# Patient Record
Sex: Female | Born: 1955 | Race: White | Hispanic: No | Marital: Single | Smoking: Never smoker
Health system: Southern US, Community
[De-identification: ages and names within clinical notes are randomized; demographics above are authoritative.]

## PROBLEM LIST (undated history)

## (undated) DIAGNOSIS — F32A Depression, unspecified: Secondary | ICD-10-CM

## (undated) DIAGNOSIS — F329 Major depressive disorder, single episode, unspecified: Secondary | ICD-10-CM

## (undated) HISTORY — DX: Depression, unspecified: F32.A

## (undated) HISTORY — DX: Major depressive disorder, single episode, unspecified: F32.9

---

## 1997-12-14 ENCOUNTER — Other Ambulatory Visit: Admission: RE | Admit: 1997-12-14 | Discharge: 1997-12-14 | Payer: Self-pay | Admitting: *Deleted

## 1999-02-02 ENCOUNTER — Other Ambulatory Visit: Admission: RE | Admit: 1999-02-02 | Discharge: 1999-02-02 | Payer: Self-pay | Admitting: *Deleted

## 2000-02-20 ENCOUNTER — Other Ambulatory Visit: Admission: RE | Admit: 2000-02-20 | Discharge: 2000-02-20 | Payer: Self-pay | Admitting: *Deleted

## 2001-02-21 ENCOUNTER — Other Ambulatory Visit: Admission: RE | Admit: 2001-02-21 | Discharge: 2001-02-21 | Payer: Self-pay | Admitting: *Deleted

## 2001-09-23 ENCOUNTER — Encounter: Admission: RE | Admit: 2001-09-23 | Discharge: 2001-09-23 | Payer: Self-pay | Admitting: Family Medicine

## 2001-09-23 ENCOUNTER — Encounter: Payer: Self-pay | Admitting: Family Medicine

## 2002-03-02 ENCOUNTER — Other Ambulatory Visit: Admission: RE | Admit: 2002-03-02 | Discharge: 2002-03-02 | Payer: Self-pay | Admitting: Obstetrics and Gynecology

## 2003-04-07 ENCOUNTER — Other Ambulatory Visit: Admission: RE | Admit: 2003-04-07 | Discharge: 2003-04-07 | Payer: Self-pay | Admitting: Obstetrics and Gynecology

## 2006-04-16 ENCOUNTER — Ambulatory Visit: Payer: Self-pay | Admitting: Family Medicine

## 2006-05-07 ENCOUNTER — Ambulatory Visit: Payer: Self-pay | Admitting: Internal Medicine

## 2006-05-16 ENCOUNTER — Ambulatory Visit: Payer: Self-pay | Admitting: Internal Medicine

## 2006-10-09 ENCOUNTER — Ambulatory Visit: Payer: Self-pay | Admitting: Internal Medicine

## 2007-12-02 ENCOUNTER — Encounter: Payer: Self-pay | Admitting: Family Medicine

## 2008-03-23 ENCOUNTER — Ambulatory Visit: Payer: Self-pay | Admitting: Family Medicine

## 2008-05-17 ENCOUNTER — Ambulatory Visit: Payer: Self-pay | Admitting: Family Medicine

## 2008-05-17 DIAGNOSIS — F329 Major depressive disorder, single episode, unspecified: Secondary | ICD-10-CM

## 2008-06-08 ENCOUNTER — Ambulatory Visit: Payer: Self-pay | Admitting: Family Medicine

## 2008-06-08 DIAGNOSIS — G4700 Insomnia, unspecified: Secondary | ICD-10-CM | POA: Insufficient documentation

## 2008-06-09 ENCOUNTER — Encounter (INDEPENDENT_AMBULATORY_CARE_PROVIDER_SITE_OTHER): Payer: Self-pay | Admitting: *Deleted

## 2008-06-28 ENCOUNTER — Telehealth: Payer: Self-pay | Admitting: Family Medicine

## 2008-07-02 ENCOUNTER — Ambulatory Visit: Payer: Self-pay | Admitting: Family Medicine

## 2009-03-21 ENCOUNTER — Ambulatory Visit: Payer: Self-pay | Admitting: Family Medicine

## 2009-05-31 ENCOUNTER — Encounter: Admission: RE | Admit: 2009-05-31 | Discharge: 2009-05-31 | Payer: Self-pay | Admitting: Obstetrics and Gynecology

## 2010-03-29 ENCOUNTER — Ambulatory Visit: Payer: Self-pay | Admitting: Family Medicine

## 2010-03-29 DIAGNOSIS — R030 Elevated blood-pressure reading, without diagnosis of hypertension: Secondary | ICD-10-CM

## 2010-03-30 ENCOUNTER — Encounter (INDEPENDENT_AMBULATORY_CARE_PROVIDER_SITE_OTHER): Payer: Self-pay | Admitting: *Deleted

## 2010-05-04 ENCOUNTER — Encounter (INDEPENDENT_AMBULATORY_CARE_PROVIDER_SITE_OTHER): Payer: Self-pay | Admitting: *Deleted

## 2010-05-08 ENCOUNTER — Ambulatory Visit: Payer: Self-pay | Admitting: Internal Medicine

## 2010-05-18 ENCOUNTER — Ambulatory Visit: Payer: Self-pay | Admitting: Internal Medicine

## 2010-05-18 LAB — HM COLONOSCOPY

## 2010-05-20 ENCOUNTER — Encounter: Payer: Self-pay | Admitting: Internal Medicine

## 2010-06-01 ENCOUNTER — Encounter: Admission: RE | Admit: 2010-06-01 | Discharge: 2010-06-01 | Payer: Self-pay | Admitting: Obstetrics and Gynecology

## 2010-07-10 ENCOUNTER — Ambulatory Visit: Payer: Self-pay | Admitting: Family Medicine

## 2010-07-10 DIAGNOSIS — J029 Acute pharyngitis, unspecified: Secondary | ICD-10-CM

## 2010-07-10 DIAGNOSIS — J069 Acute upper respiratory infection, unspecified: Secondary | ICD-10-CM | POA: Insufficient documentation

## 2010-07-10 LAB — CONVERTED CEMR LAB: Rapid Strep: NEGATIVE

## 2010-08-17 ENCOUNTER — Encounter
Admission: RE | Admit: 2010-08-17 | Discharge: 2010-08-17 | Payer: Self-pay | Source: Home / Self Care | Attending: Obstetrics and Gynecology | Admitting: Obstetrics and Gynecology

## 2010-08-29 NOTE — Letter (Signed)
Summary: Patient Notice- Polyp Results   Gastroenterology  [address] [name], [place] [address]   Phone: [phone]  Fax: [phone]        May 20, 2010 MRN: [id number]    [facility] [place] [address] [place], [place]  [address]    Dear Jaime Henderson,  I am pleased to inform you that the colon polyp(s) removed during your recent colonoscopy was (were) found to be benign (no cancer detected) upon pathologic examination.The polyps were adenomatous ( precancerous)  I recommend you have a repeat colonoscopy examination in 5_ years to look for recurrent polyps, as having colon polyps increases your risk for having recurrent polyps or even colon cancer in the future.  Should you develop new or worsening symptoms of abdominal pain, bowel habit changes or bleeding from the rectum or bowels, please schedule an evaluation with either your primary care physician or with me.  Additional information/recommendations:  _x_ No further action with gastroenterology is needed at this time. Please      follow-up with your primary care physician for your other healthcare      needs.  __ Please call [phone] to schedule a return visit to review your      situation.  __ Please keep your follow-up visit as already scheduled.  __ Continue treatment plan as outlined the day of your exam.  Please call us if you are having persistent problems or have questions about your condition that have not been fully answered at this time.  Sincerely,  Hart Carwin MD  This letter has been electronically signed by your physician.  Appended Document: Patient Notice- Polyp Results letter mailed

## 2010-08-29 NOTE — Procedures (Signed)
Summary: Colonoscopy  Patient: Nhyla Nappi Note: All result statuses are Final unless otherwise noted.  Tests: (1) Colonoscopy (COL)   COL Colonoscopy           DONE     [facility]     [address]     [place], [place]  [address]           COLONOSCOPY PROCEDURE REPORT           PATIENT:  Prudence, Heiny  MR#:  [id number]     BIRTHDATE:  [birth year], 54 yrs. old  GENDER:  female     ENDOSCOPIST:  Hedwig Morton. Juanda Chance, MD     REF. BY:  Loreen Freud, DO     PROCEDURE DATE:  05/18/2010     PROCEDURE:  Colonoscopy [address]     ASA CLASS:  Class I     INDICATIONS:  Routine Risk Screening     MEDICATIONS:   Versed 10 mg, Fentanyl 100 mcg           DESCRIPTION OF PROCEDURE:   After the risks benefits and     alternatives of the procedure were thoroughly explained, informed     consent was obtained.  Digital rectal exam was performed and     revealed no rectal masses.   The LB [id number] endoscope     was introduced through the anus and advanced to the cecum, which     was identified by both the appendix and ileocecal valve, without     limitations.  The quality of the prep was good, using MiraLax.     The instrument was then slowly withdrawn as the colon was fully     examined.     <<PROCEDUREIMAGES>>           FINDINGS:  Two polyps were found in the sigmoid colon. 3-5 mm     sessile polyps at 20 and 30 cm Polyp was snared without cautery.     Retrieval was successful (see image5 and image8). snare polyp     Moderate diverticulosis was found throughout the colon (see image7     and image1).  This was otherwise a normal examination of the colon     (see image9, image4, image3, and image2).   Retroflexed views in     the rectum revealed no abnormalities.    The scope was then     withdrawn from the patient and the procedure completed.           COMPLICATIONS:  None     ENDOSCOPIC IMPRESSION:     1) Two polyps in the sigmoid colon     2) Moderate diverticulosis throughout the  colon     3) Otherwise normal examination     RECOMMENDATIONS:     1) Await pathology results     2) High fiber diet.     REPEAT EXAM:  In 5 year(s) for.           ______________________________     Hedwig Morton. Juanda Chance, MD           CC:           n.     eSIGNED:   Hedwig Morton. Tayley Mudrick at 05/18/2010 09:50 AM           Aundra Millet, [id number]  Note: An exclamation mark (!) indicates a result that was not dispersed into the flowsheet. Document Creation Date: 05/18/2010 9:50 AM _______________________________________________________________________  (1) Order result status:  Final Collection or observation date-time: 05/18/2010 09:42 Requested date-time:  Receipt date-time:  Reported date-time:  Referring Physician:   Ordering Physician: Lina Sar [id number]) Specimen Source:  Source: Launa Grill Order Number: [id number] Lab site:   Appended Document: Colonoscopy     Procedures Next Due Date:    Colonoscopy: 04/2015

## 2010-08-29 NOTE — Assessment & Plan Note (Signed)
Summary: NEEDS O/V TO REFILL ZOLOFT////SPH   Vital Signs:  Patient profile:   55 year old female Height:      71 inches Weight:      161.8 pounds BMI:     22.65 Temp:     98.1 degrees F oral Pulse rate:   68 / minute Pulse rhythm:   regular BP sitting:   140 / 96  (right arm)  Vitals Entered By: Almeta Monas CMA Duncan Dull) (March 29, 2010 8:20 AM)  Serial Vital Signs/Assessments:  Time      Position  BP       Pulse  Resp  Temp     By 8:38 AM             140/82                         Loreen Freud DO  CC: Needs Zoloft refilled   History of Present Illness: Pt here for med refill.  zoloft working well.  No complaints.    Current Medications (verified): 1)  Zoloft 50 Mg Tabs (Sertraline Hcl) .... Take 1 Tab Once Daily**office Visit Due Now**  Allergies (verified): No Known Drug Allergies  Family History: Reviewed history from 03/23/2008 and no changes required. B-- aplastic anemia Family History Breast cancer 1st degree relative 56yo Family History of Prostate CA 1st degree relative 56yo  Social History: Reviewed history from 05/17/2008 and no changes required. Divorced Drug use-no Regular exercise-yes  Review of Systems      See HPI  Physical Exam  General:  Well-developed,well-nourished,in no acute distress; alert,appropriate and cooperative throughout examination Psych:  Cognition and judgment appear intact. Alert and cooperative with normal attention span and concentration. No apparent delusions, illusions, hallucinations   Impression & Recommendations:  Problem # 1:  DEPRESSION (ICD-311)  Her updated medication list for this problem includes:    Zoloft 50 Mg Tabs (Sertraline hcl) .Marland Kitchen... Take 1 tab once daily  Problem # 2:  ELEVATED BLOOD PRESSURE (ICD-796.2)  BP today: 140/96 Prior BP: 140/100 (03/21/2009)  Instructed in low sodium diet (DASH Handout) and behavior modification.    Complete Medication List: 1)  Zoloft 50 Mg Tabs (Sertraline hcl)  .... Take 1 tab once daily  Other Orders: Gastroenterology Referral (GI)  Patient Instructions: 1)  Please schedule a follow-up appointment in 3 months .  Prescriptions: ZOLOFT 50 MG TABS (SERTRALINE HCL) take 1 tab once daily  #30 x 11   Entered and Authorized by:   Loreen Freud DO   Signed by:   Loreen Freud DO on 03/29/2010   Method used:   Electronically to        [facility] (retail)       [address]       [place], [place]  [address]       Ph: [phone]       Fax: [phone]   RxID:   [id number]

## 2010-08-29 NOTE — Letter (Signed)
Summary: Pre Visit Letter Revised  [facility]  [address] [place], [place] [address]   Phone: [phone]  Fax: [phone]        03/30/2010 MRN: [id number] Jaime Henderson [address] [place], [place]  [address]             Procedure Date:  05-18-10   Welcome to the Gastroenterology Division at [facility].    You are scheduled to see a nurse for your pre-procedure visit on 05-04-10 at 4:00p.m. on the 3rd floor at [facility], [address]  We ask that you try to arrive at our office 15 minutes prior to your appointment time to allow for check-in.  Please take a minute to review the attached form.  If you answer "Yes" to one or more of the questions on the first page, we ask that you call the person listed at your earliest opportunity.  If you answer "No" to all of the questions, please complete the rest of the form and bring it to your appointment.    Your nurse visit will consist of discussing your medical and surgical history, your immediate family medical history, and your medications.   If you are unable to list all of your medications on the form, please bring the medication bottles to your appointment and we will list them.  We will need to be aware of both prescribed and over the counter drugs.  We will need to know exact dosage information as well.    Please be prepared to read and sign documents such as consent forms, a financial agreement, and acknowledgement forms.  If necessary, and with your consent, a friend or relative is welcome to sit-in on the nurse visit with you.  Please bring your insurance card so that we may make a copy of it.  If your insurance requires a referral to see a specialist, please bring your referral form from your primary care physician.  No co-pay is required for this nurse visit.     If you cannot keep your appointment, please call [phone] to cancel or reschedule prior to your appointment date.  This allows [place]  the opportunity to schedule an appointment for another patient in need of care.    Thank you for choosing [facility] for your medical needs.  We appreciate the opportunity to care for you.  Please visit [place] at our website  to learn more about our practice.  Sincerely, The Gastroenterology Division

## 2010-08-29 NOTE — Miscellaneous (Signed)
Summary: [facility]  Clinical Lists Changes  Medications: Added new medication of MIRALAX   POWD (POLYETHYLENE GLYCOL 3350) As per prep  instructions. - Signed Added new medication of DULCOLAX 5 MG  TBEC (BISACODYL) Day before procedure take 2 at 3pm and 2 at 8pm. - Signed Added new medication of REGLAN 10 MG  TABS (METOCLOPRAMIDE HCL) As per prep instructions. - Signed Rx of MIRALAX   POWD (POLYETHYLENE GLYCOL 3350) As per prep  instructions.;  #255gm x 0;  Signed;  Entered by: Ezra Sites RN;  Authorized by: Hart Carwin MD;  Method used: Electronically to [facility] [address], [place], [place], [place]  [address], Ph: [phone], Fax: [phone] Rx of DULCOLAX 5 MG  TBEC (BISACODYL) Day before procedure take 2 at 3pm and 2 at 8pm.;  #4 x 0;  Signed;  Entered by: Ezra Sites RN;  Authorized by: Hart Carwin MD;  Method used: Electronically to [facility] [address] [place], [place], [place]  [address], Ph: [phone], Fax: [phone] Rx of REGLAN 10 MG  TABS (METOCLOPRAMIDE HCL) As per prep instructions.;  #2 x 0;  Signed;  Entered by: Ezra Sites RN;  Authorized by: Hart Carwin MD;  Method used: Electronically to [facility] [address] [place], [place], [place]  [address], Ph: [phone], Fax: [phone] Observations: Added new observation of NKA: T (05/08/2010 9:13)    Prescriptions: REGLAN 10 MG  TABS (METOCLOPRAMIDE HCL) As per prep instructions.  #2 x 0   Entered by:   Ezra Sites RN   Authorized by:   Hart Carwin MD   Signed by:   Ezra Sites RN on 05/08/2010   Method used:   Electronically to        [facility] (retail)       [address]       [place], [place]  [address]       Ph: [phone]       Fax: [phone]   RxID:   [id number] DULCOLAX 5 MG  TBEC (BISACODYL) Day before procedure take 2 at 3pm and 2 at 8pm.  #4 x 0   Entered by:    Ezra Sites RN   Authorized by:   Hart Carwin MD   Signed by:   Ezra Sites RN on 05/08/2010   Method used:   Electronically to        [facility] (retail)       [address]       [place], [place]  [address]       Ph: [phone]       Fax: [phone]   RxID:   [id number] MIRALAX   POWD (POLYETHYLENE GLYCOL 3350) As per prep  instructions.  #255gm x 0   Entered by:   Ezra Sites RN   Authorized by:   Hart Carwin MD   Signed by:   Ezra Sites RN on 05/08/2010   Method used:   Electronically to        [facility] (retail)       [address]       [place], [place]  [address]       Ph: [phone]       Fax: [phone]   RxID:   [id number]

## 2010-08-29 NOTE — Letter (Signed)
Summary: Miralax Instructions  [facility]  [address]   [place], [place] [address]   Phone: [phone]  Fax: [phone]       Jaime Henderson    12-08-1955    MRN: [id number]       Procedure Day Dorna Bloom: Thursday, 05-18-10     Arrival Time: 8:00 a.m.     Procedure Time: 9:00 a.m.     Location of Procedure:                     x  [facility] (4th Floor)    PREPARATION FOR COLONOSCOPY WITH MIRALAX  Starting 5 days prior to your procedure 05-13-10 do not eat nuts, seeds, popcorn, corn, beans, peas,  salads, or any raw vegetables.  Do not take any fiber supplements (e.g. Metamucil, Citrucel, and Benefiber). ____________________________________________________________________________________________________   THE DAY BEFORE YOUR PROCEDURE         DATE: 05-17-10 DAY: Wednesday  1   Drink clear liquids the entire day-NO SOLID FOOD  2   Do not drink anything colored red or purple.  Avoid juices with pulp.  No orange juice.  3   Drink at least 64 oz. (8 glasses) of fluid/clear liquids during the day to prevent dehydration and help the prep work efficiently.  CLEAR LIQUIDS INCLUDE: Water Jello Ice Popsicles Tea (sugar ok, no milk/cream) Powdered fruit flavored drinks Coffee (sugar ok, no milk/cream) Gatorade Juice: apple, white grape, white cranberry  Lemonade Clear bullion, consomm, broth Carbonated beverages (any kind) Strained chicken noodle soup Hard Candy  4   Mix the entire bottle of Miralax with 64 oz. of Gatorade/Powerade in the morning and put in the refrigerator to chill.  5   At 3:00 pm take 2 Dulcolax/Bisacodyl tablets.  6   At 4:30 pm take one Reglan/Metoclopramide tablet.  7  Starting at 5:00 pm drink one 8 oz glass of the Miralax mixture every 15-20 minutes until you have finished drinking the entire 64 oz.  You should finish drinking prep around 7:30 or 8:00 pm.  8   If you are nauseated, you may take the 2nd  Reglan/Metoclopramide tablet at 6:30 pm.        9    At 8:00 pm take 2 more DULCOLAX/Bisacodyl tablets.     THE DAY OF YOUR PROCEDURE      DATE:  05-18-10  DAY: Thursday  You may drink clear liquids until  7:00 a.m.  (2 HOURS BEFORE PROCEDURE).   MEDICATION INSTRUCTIONS  Unless otherwise instructed, you should take regular prescription medications with a small sip of water as early as possible the morning of your procedure.         OTHER INSTRUCTIONS  You will need a responsible adult at least 56 years of age to accompany you and drive you home.   This person must remain in the waiting room during your procedure.  Wear loose fitting clothing that is easily removed.  Leave jewelry and other valuables at home.  However, you may wish to bring a book to read or an iPod/MP3 player to listen to music as you wait for your procedure to start.  Remove all body piercing jewelry and leave at home.  Total time from sign-in until discharge is approximately 2-3 hours.  You should go home directly after your procedure and rest.  You can resume normal activities the day after your procedure.  The day of your procedure you should not:   Drive  Make legal decisions   Operate machinery   Drink alcohol   Return to work  You will receive specific instructions about eating, activities and medications before you leave.   The above instructions have been reviewed and explained to me by   Ezra Sites RN  May 08, 2010 9:36 AM     I fully understand and can verbalize these instructions _____________________________ Date _______

## 2010-08-31 NOTE — Assessment & Plan Note (Signed)
Summary: SORE THROAT/KN   Vital Signs:  Patient profile:   55 year old female Weight:      162.2 pounds O2 Sat:      93 % on Room air Temp:     98.3 degrees F oral Pulse rate:   88 / minute Pulse rhythm:   regular BP sitting:   138 / 92  (right arm) Cuff size:   regular  Vitals Entered By: Almeta Monas CMA Duncan Dull) (July 10, 2010 2:01 PM)  O2 Flow:  Room air CC: x4days c/o sore throat, nasal, sneezing, congestion, ear pain, sinus pressure, URI symptoms   History of Present Illness:       This is a 55 year old woman who presents with URI symptoms.  The symptoms began 4 days ago.  Pt states it started with sore throat over weekend and now is very congested with sinus pressure B/L.  The patient complains of nasal congestion, sore throat, productive cough, and earache.  The patient denies fever, low-grade fever (<100.5 degrees), fever of 100.5-103 degrees, fever of 103.1-104 degrees, fever to >104 degrees, stiff neck, dyspnea, wheezing, rash, vomiting, diarrhea, use of an antipyretic, and response to antipyretic.  The patient denies itchy watery eyes, itchy throat, sneezing, seasonal symptoms, response to antihistamine, headache, muscle aches, and severe fatigue.  The patient denies the following risk factors for Strep sinusitis: unilateral facial pain, unilateral nasal discharge, poor response to decongestant, double sickening, tooth pain, Strep exposure, tender adenopathy, and absence of cough.    Current Medications (verified): 1)  Zoloft 50 Mg Tabs (Sertraline Hcl) .... Take 1 Tab Once Daily  Allergies (verified): No Known Drug Allergies  Past History:  Past Medical History: Last updated: 05/17/2008 Depression  Family History: Last updated: 03/29/2010 B-- aplastic anemia Family History Breast cancer 1st degree relative 56yo Family History of Prostate CA 1st degree relative 56yo  Social History: Last updated: 05/17/2008 Divorced Drug use-no Regular  exercise-yes  Risk Factors: Exercise: yes (05/17/2008)  Social History: Reviewed history from 05/17/2008 and no changes required. Divorced Drug use-no Regular exercise-yes  Review of Systems      See HPI  Physical Exam  General:  Well-developed,well-nourished,in no acute distress; alert,appropriate and cooperative throughout examination Ears:  External ear exam shows no significant lesions or deformities.  Otoscopic examination reveals clear canals, tympanic membranes are intact bilaterally without bulging, retraction, inflammation or discharge. Hearing is grossly normal bilaterally. Nose:  no external deformity, mucosal erythema, and mucosal edema.   Mouth:  pharyngeal erythema and postnasal drip.   Neck:  cervical lymphadenopathy.   Lungs:  Normal respiratory effort, chest expands symmetrically. Lungs are clear to auscultation, no crackles or wheezes. Heart:  normal rate and no murmur.   Psych:  Oriented X3 and normally interactive.     Impression & Recommendations:  Problem # 1:  ACUTE PHARYNGITIS (ICD-462)  Orders: Rapid Strep ([address])  Instructed to complete antibiotics and call if not improved in 48 hours.   Her updated medication list for this problem includes:    Ceftin 500 Mg Tabs (Cefuroxime axetil) .Marland Kitchen... 1 by mouth two times a day  Problem # 2:  URI (ICD-465.9)  Instructed on symptomatic treatment. Call if symptoms persist or worsen.   Complete Medication List: 1)  Zoloft 50 Mg Tabs (Sertraline hcl) .... Take 1 tab once daily 2)  Ceftin 500 Mg Tabs (Cefuroxime axetil) .Marland Kitchen.. 1 by mouth two times a day  Patient Instructions: 1)  Get plenty of rest, drink lots of  clear liquids, and use Tylenol or Ibuprofen for fever and comfort. Return in 7-10 days if you're not better: sooner if you'er feeling worse.  Prescriptions: CEFTIN 500 MG TABS (CEFUROXIME AXETIL) 1 by mouth two times a day  #20 x 0   Entered and Authorized by:   Loreen Freud DO   Signed by:   Loreen Freud DO on 07/10/2010   Method used:   Electronically to        [facility] (retail)       [address]       [place], [place]  [address]       Ph: [phone]       Fax: [phone]   RxID:   [id number]    Orders Added: 1)  Est. Patient Level III [[address]] 2)  Rapid Strep [[address]]    Laboratory Results    Other Tests  Rapid Strep: negative

## 2011-04-18 ENCOUNTER — Other Ambulatory Visit: Payer: Self-pay

## 2011-04-18 MED ORDER — SERTRALINE HCL 50 MG PO TABS: 50.0000 mg | ORAL_TABLET | Freq: Every day | ORAL | Status: DC

## 2011-04-18 NOTE — Telephone Encounter (Signed)
Last seen 07/10/10 and filled 03/18/11 please advise   KP

## 2011-04-18 NOTE — Telephone Encounter (Signed)
Letter mailed to contact the office.     KP 

## 2011-05-02 ENCOUNTER — Other Ambulatory Visit: Payer: Self-pay | Admitting: Obstetrics and Gynecology

## 2011-05-02 DIAGNOSIS — Z1231 Encounter for screening mammogram for malignant neoplasm of breast: Secondary | ICD-10-CM

## 2011-05-14 ENCOUNTER — Emergency Department (HOSPITAL_COMMUNITY): Payer: No Typology Code available for payment source

## 2011-05-14 ENCOUNTER — Emergency Department (HOSPITAL_COMMUNITY)
Admission: EM | Admit: 2011-05-14 | Discharge: 2011-05-14 | Disposition: A | Discharge status: Home / Self Care | Payer: No Typology Code available for payment source | Attending: Emergency Medicine | Admitting: Emergency Medicine

## 2011-05-14 ENCOUNTER — Ambulatory Visit: Payer: Self-pay | Admitting: Family Medicine

## 2011-05-14 DIAGNOSIS — R1031 Right lower quadrant pain: Secondary | ICD-10-CM | POA: Insufficient documentation

## 2011-05-14 DIAGNOSIS — Z79899 Other long term (current) drug therapy: Secondary | ICD-10-CM | POA: Insufficient documentation

## 2011-05-14 DIAGNOSIS — X58XXXA Exposure to other specified factors, initial encounter: Secondary | ICD-10-CM | POA: Insufficient documentation

## 2011-05-14 DIAGNOSIS — F3289 Other specified depressive episodes: Secondary | ICD-10-CM | POA: Insufficient documentation

## 2011-05-14 DIAGNOSIS — F329 Major depressive disorder, single episode, unspecified: Secondary | ICD-10-CM | POA: Insufficient documentation

## 2011-05-14 DIAGNOSIS — S301XXA Contusion of abdominal wall, initial encounter: Secondary | ICD-10-CM | POA: Insufficient documentation

## 2011-05-14 LAB — URINALYSIS, ROUTINE W REFLEX MICROSCOPIC
Bilirubin Urine: NEGATIVE
Glucose, UA: NEGATIVE mg/dL
Hgb urine dipstick: NEGATIVE
Ketones, ur: NEGATIVE mg/dL
pH: 6 (ref 5.0–8.0)

## 2011-05-14 LAB — COMPREHENSIVE METABOLIC PANEL
Albumin: 4.5 g/dL (ref 3.5–5.2)
Alkaline Phosphatase: 75 U/L (ref 39–117)
BUN: 17 mg/dL (ref 6–23)
Potassium: 4 mEq/L (ref 3.5–5.1)
Sodium: 138 mEq/L (ref 135–145)
Total Protein: 7.9 g/dL (ref 6.0–8.3)

## 2011-05-14 LAB — LIPASE, BLOOD: Lipase: 43 U/L (ref 11–59)

## 2011-05-14 LAB — CBC
HCT: 41.1 % (ref 36.0–46.0)
Hemoglobin: 13.7 g/dL (ref 12.0–15.0)
MCHC: 33.3 g/dL (ref 30.0–36.0)
MCV: 89.5 fL (ref 78.0–100.0)
RDW: 11.6 % (ref 11.5–15.5)
WBC: 6.5 10*3/uL (ref 4.0–10.5)

## 2011-05-14 LAB — DIFFERENTIAL
Eosinophils Relative: 4 % (ref 0–5)
Lymphocytes Relative: 16 % (ref 12–46)
Lymphs Abs: 1 10*3/uL (ref 0.7–4.0)
Monocytes Absolute: 0.4 10*3/uL (ref 0.1–1.0)
Neutro Abs: 4.8 10*3/uL (ref 1.7–7.7)

## 2011-05-14 MED ORDER — IOHEXOL 300 MG/ML  SOLN
100.0000 mL | Freq: Once | INTRAMUSCULAR | Status: AC | PRN
  Administered 2011-05-14: 100 mL via INTRAVENOUS

## 2011-05-16 ENCOUNTER — Encounter: Payer: Self-pay | Admitting: Family Medicine

## 2011-05-17 ENCOUNTER — Ambulatory Visit (INDEPENDENT_AMBULATORY_CARE_PROVIDER_SITE_OTHER): Payer: No Typology Code available for payment source | Admitting: Family Medicine

## 2011-05-17 ENCOUNTER — Encounter: Payer: Self-pay | Admitting: Family Medicine

## 2011-05-17 VITALS — BP 144/90 | HR 88 | Temp 98.6°F | Ht 71.0 in | Wt 170.0 lb

## 2011-05-17 DIAGNOSIS — Z23 Encounter for immunization: Secondary | ICD-10-CM

## 2011-05-17 DIAGNOSIS — F329 Major depressive disorder, single episode, unspecified: Secondary | ICD-10-CM

## 2011-05-17 MED ORDER — SERTRALINE HCL 50 MG PO TABS: 50.0000 mg | ORAL_TABLET | Freq: Every day | ORAL | Status: DC

## 2011-05-17 NOTE — Patient Instructions (Signed)

## 2011-05-17 NOTE — Assessment & Plan Note (Signed)
Refill zoloft

## 2011-05-17 NOTE — Progress Notes (Signed)
Addended by: Arnette Norris on: 05/17/2011 02:33 PM   Modules accepted: Orders

## 2011-05-17 NOTE — Progress Notes (Signed)
  Subjective:    Patient ID: Jaime Henderson, female    DOB: [birth year], 55 y.o.   MRN: [id number]  HPI Pt is here to f/u depression.  She is doing well.     Review of Systems As above    Objective:   Physical Exam  Constitutional: She is oriented to person, place, and time. She appears well-developed and well-nourished.  Cardiovascular: Normal rate, regular rhythm and normal heart sounds.   No murmur heard. Neurological: She is alert and oriented to person, place, and time.  Psychiatric: She has a normal mood and affect. Her behavior is normal. Judgment and thought content normal.          Assessment & Plan:

## 2011-06-05 ENCOUNTER — Ambulatory Visit
Admission: RE | Admit: 2011-06-05 | Discharge: 2011-06-05 | Disposition: A | Discharge status: Home / Self Care | Payer: No Typology Code available for payment source | Source: Ambulatory Visit | Attending: Obstetrics and Gynecology | Admitting: Obstetrics and Gynecology

## 2011-06-05 DIAGNOSIS — Z1231 Encounter for screening mammogram for malignant neoplasm of breast: Secondary | ICD-10-CM

## 2011-08-25 LAB — HM PAP SMEAR

## 2011-10-26 IMAGING — CT CT ABD-PELV W/ CM
2 of 5 series · 17 of 46 positions shown, 19 images · IV contrast (100 ML OMNI 300)
Comparison: None

CLINICAL DATA: Right lower quadrant abdominal pain for 1 week

CT ABDOMEN AND PELVIS WITH CONTRAST
TECHNIQUE: Multidetector CT imaging of the abdomen and pelvis was
performed following the standard protocol during bolus
administration of intravenous contrast. Sagittal and coronal MPR
images reconstructed from axial data set.
Contrast: 100mL OMNIPAQUE IOHEXOL 300 MG/ML IV SOLN Dilute oral
contrast.

[Series 2: abd/pel with · axial · 0.74mm/px · z∈[-380,+25]mm · 14 of 91 slices shown, 16 images]
[im 5/91  soft-tissue]
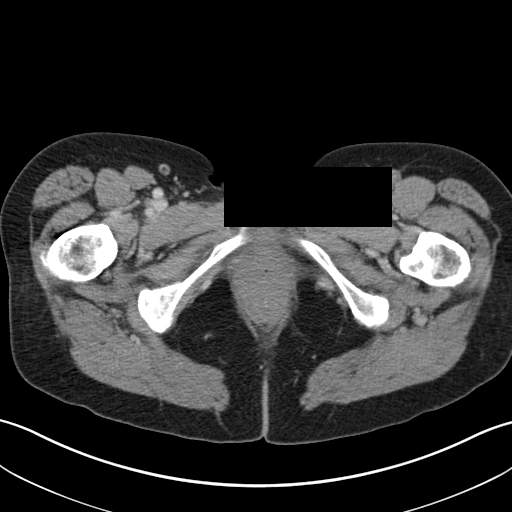
[im 5/91  bone]
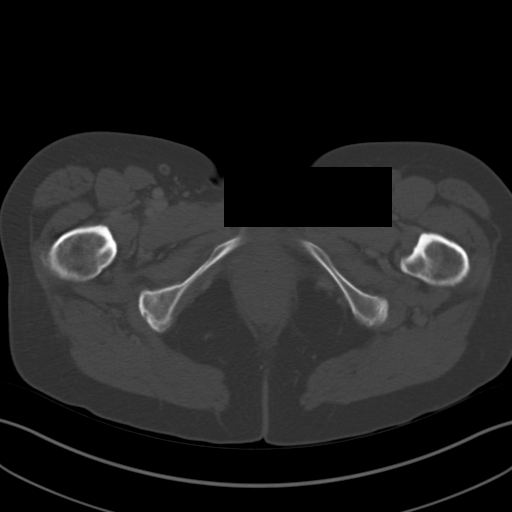
[im 10/91  soft-tissue]
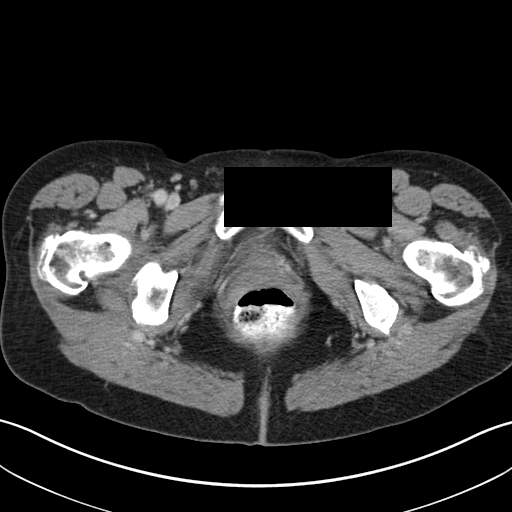
[im 19/91  soft-tissue]
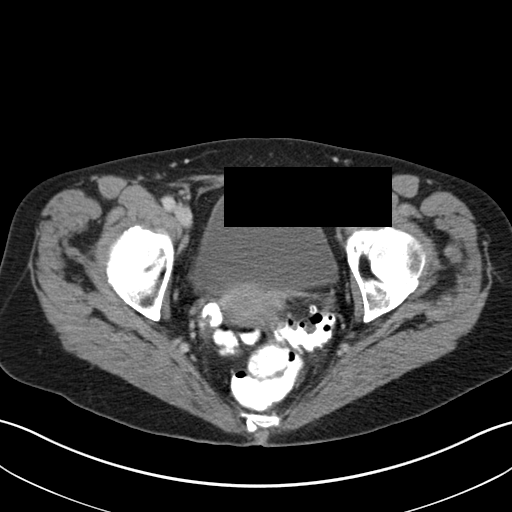
[im 24/91  soft-tissue]
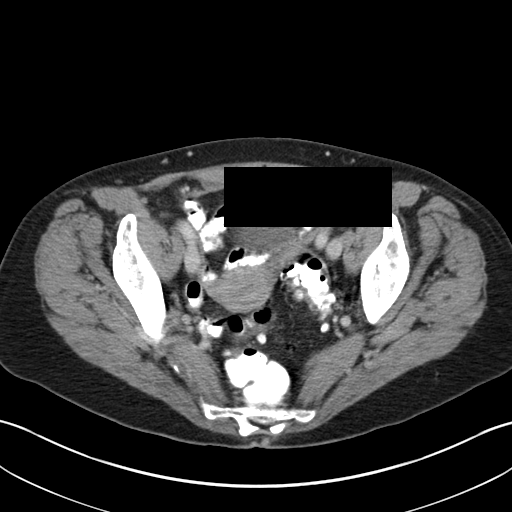
[im 29/91  soft-tissue]
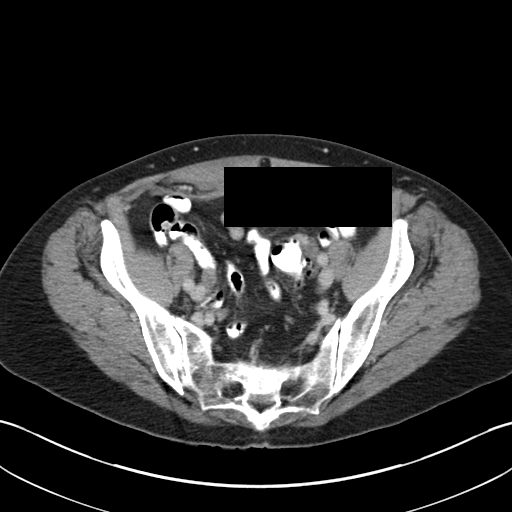
[im 38/91  soft-tissue]
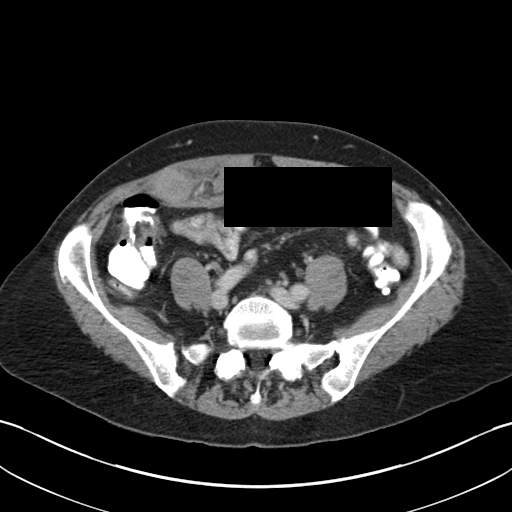
[im 43/91  soft-tissue]
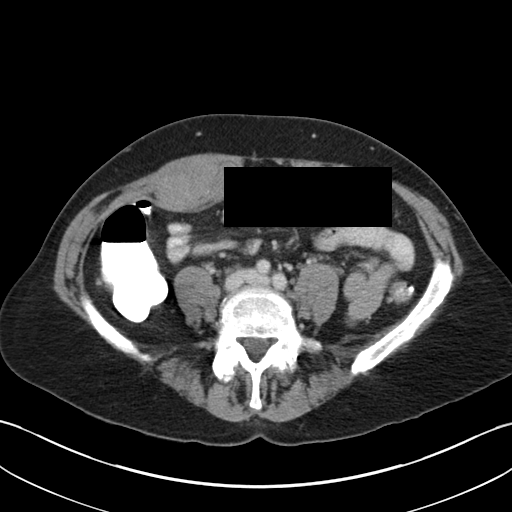
[im 48/91  soft-tissue]
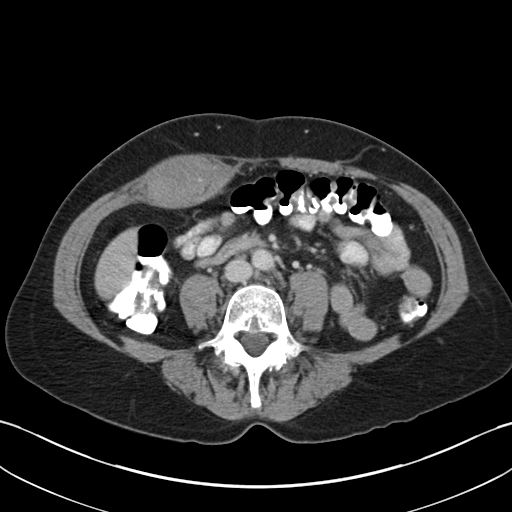
[im 53/91  soft-tissue]
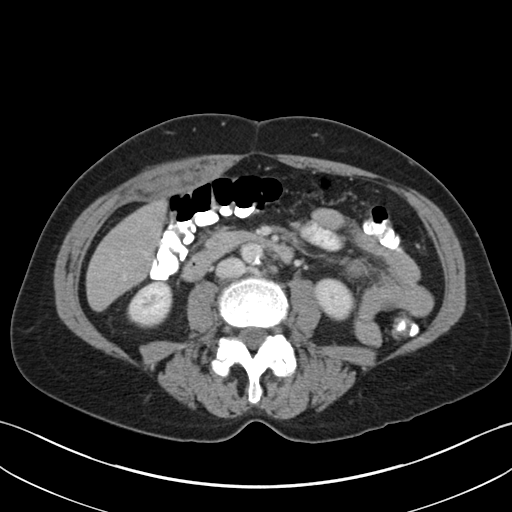
[im 53/91  bone]
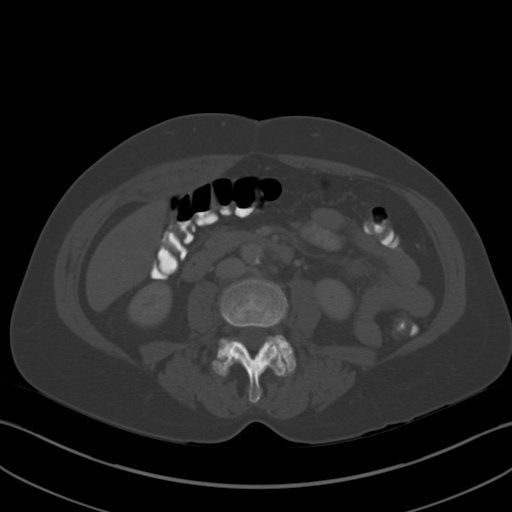
[im 62/91  soft-tissue]
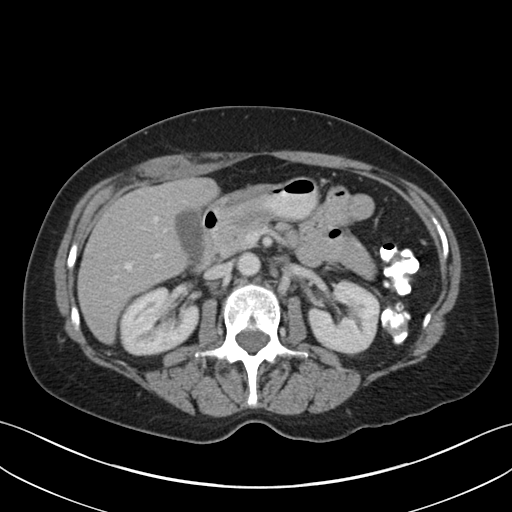
[im 67/91  soft-tissue]
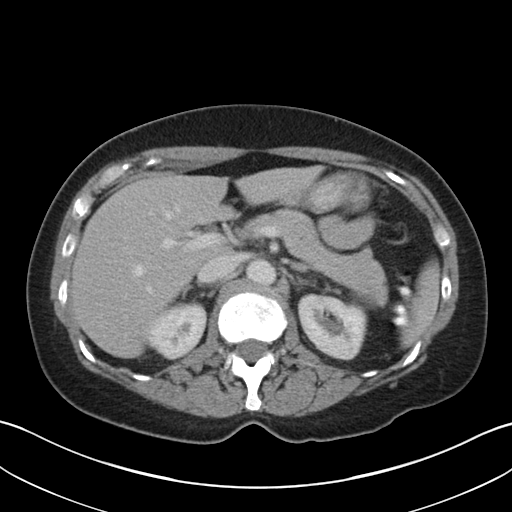
[im 72/91  soft-tissue]
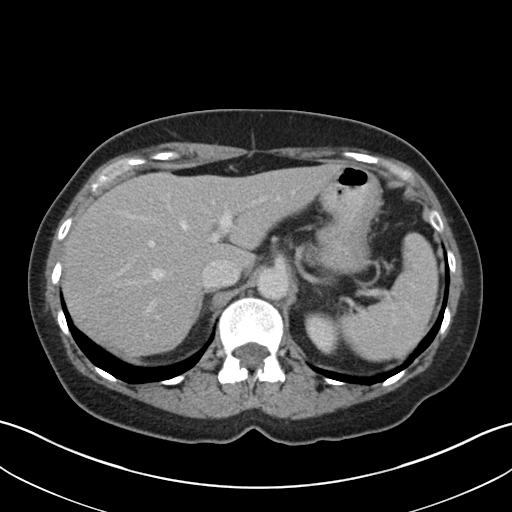
[im 81/91  soft-tissue]
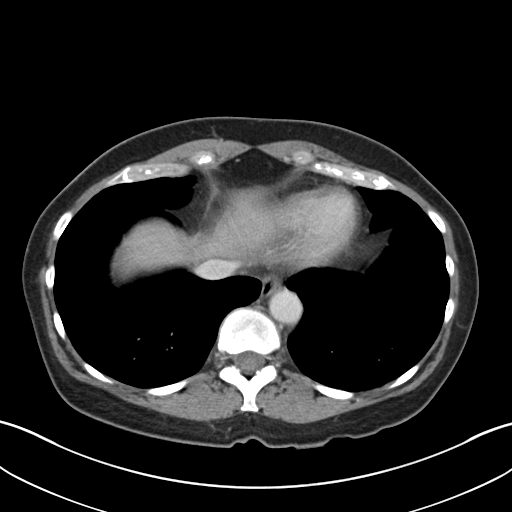
[im 86/91  soft-tissue]
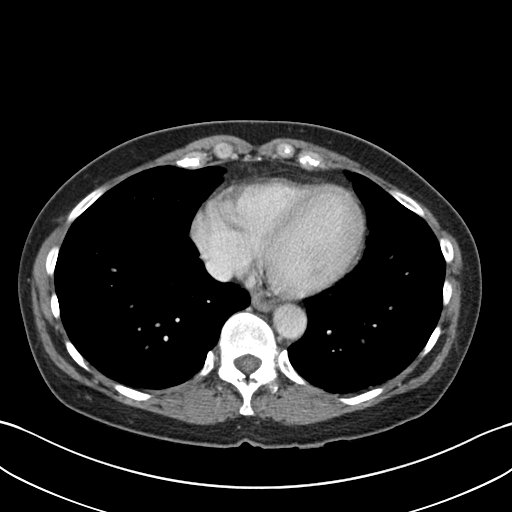

[Series 5: coronal · coronal · 0.74mm/px · 3 of 48 slices shown]
[im 16/48  soft-tissue]
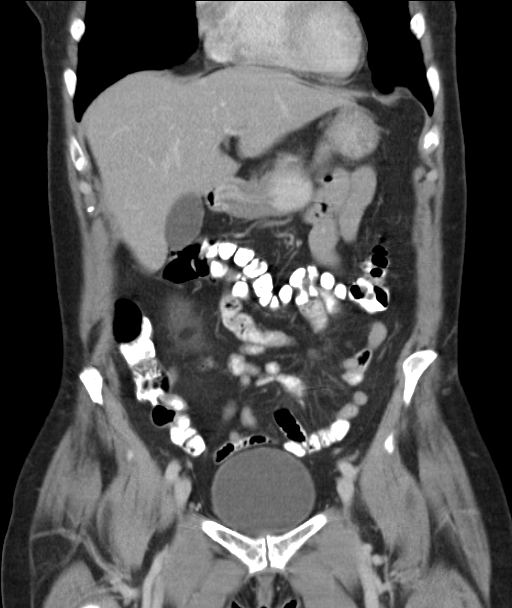
[im 21/48  soft-tissue]
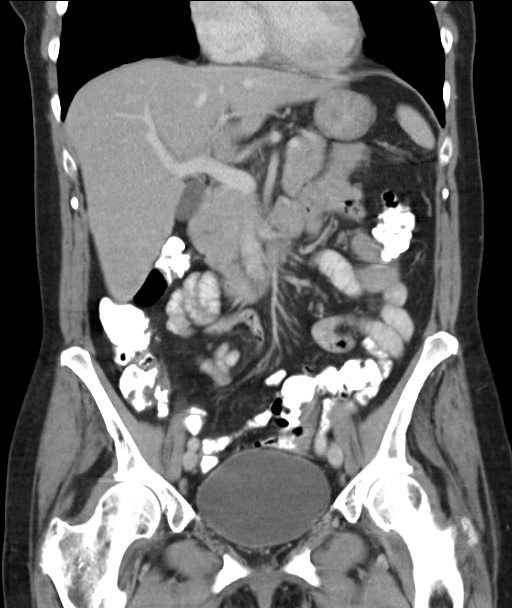
[im 27/48  soft-tissue]
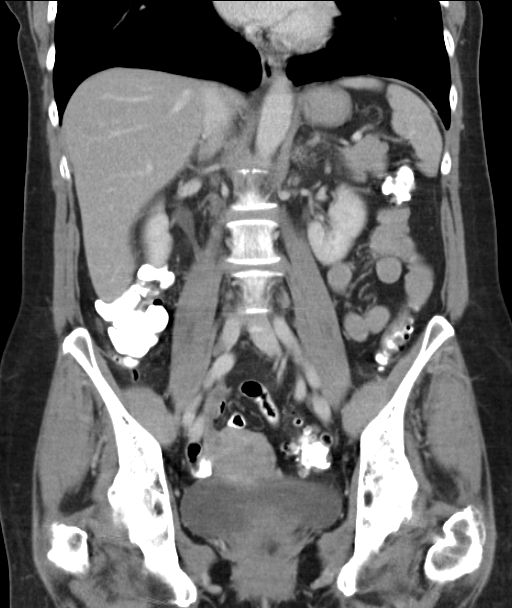

[17 of 46 positions shown; findings below may reference images not displayed]

FINDINGS: Lung bases clear.
Liver, spleen, pancreas, kidneys, and adrenal glands normal.
Scattered diverticula descending and sigmoid colon without evidence
of diverticulitis.
Unremarkable bladder, uterus, adnexae, and appendix.
Stomach and small bowel loops grossly normal appearance.
Intermediate attenuation enlargement of the right rectus sheath
compatible with rectus sheath hematoma, measuring 7.0 x 3.9 cm
greatest axial dimensions image 49 and extending 14 cm length.
Bones appear demineralized.
IMPRESSION: Large right rectus sheath hematoma 7.0 x 3.9 x 14.0 cm in size.
Descending and sigmoid colonic diverticulosis without evidence of
acute diverticulitis.

## 2012-05-07 ENCOUNTER — Other Ambulatory Visit: Payer: Self-pay | Admitting: Family Medicine

## 2012-05-07 NOTE — Telephone Encounter (Signed)
Correction pt has a work conflict put her in 10.25.13 at 315pm, pt would like enough meds to get her thru til then Thanks

## 2012-05-07 NOTE — Telephone Encounter (Signed)
Pt will be in Monday 10.14.13 at 10am

## 2012-05-07 NOTE — Telephone Encounter (Signed)
Please offer this patient an apt for medication management.     KP

## 2012-05-07 NOTE — Telephone Encounter (Signed)
402pm chp abm lm to call & schedule 15-min meds mgmt appt with dr.lowne

## 2012-05-12 ENCOUNTER — Other Ambulatory Visit: Payer: Self-pay | Admitting: Obstetrics and Gynecology

## 2012-05-12 ENCOUNTER — Ambulatory Visit: Payer: No Typology Code available for payment source | Admitting: Family Medicine

## 2012-05-12 DIAGNOSIS — Z1231 Encounter for screening mammogram for malignant neoplasm of breast: Secondary | ICD-10-CM

## 2012-05-23 ENCOUNTER — Ambulatory Visit (INDEPENDENT_AMBULATORY_CARE_PROVIDER_SITE_OTHER): Payer: No Typology Code available for payment source | Admitting: Family Medicine

## 2012-05-23 ENCOUNTER — Encounter: Payer: Self-pay | Admitting: Family Medicine

## 2012-05-23 VITALS — BP 134/90 | HR 73 | Temp 98.3°F | Ht 71.0 in | Wt 180.8 lb

## 2012-05-23 DIAGNOSIS — F329 Major depressive disorder, single episode, unspecified: Secondary | ICD-10-CM

## 2012-05-23 DIAGNOSIS — M19079 Primary osteoarthritis, unspecified ankle and foot: Secondary | ICD-10-CM

## 2012-05-23 DIAGNOSIS — F3289 Other specified depressive episodes: Secondary | ICD-10-CM

## 2012-05-23 DIAGNOSIS — Z23 Encounter for immunization: Secondary | ICD-10-CM

## 2012-05-23 MED ORDER — SERTRALINE HCL 50 MG PO TABS: 50.0000 mg | ORAL_TABLET | Freq: Every day | ORAL | Status: DC

## 2012-05-23 MED ORDER — CELECOXIB 200 MG PO CAPS: 200.0000 mg | ORAL_CAPSULE | Freq: Two times a day (BID) | ORAL | Status: DC

## 2012-05-23 NOTE — Assessment & Plan Note (Signed)
Refill zoloft Symptoms stable

## 2012-05-23 NOTE — Progress Notes (Signed)
  Subjective:    Patient ID: Jaime Henderson, female    DOB: 15-Nov-1955, 56 y.o.   MRN: [id number]  HPI Pt here f/u depression and med refill.  She is doing well with meds and would like a refill.   Pt also c/o pain in big toe R foot.  She saw ortho 6 years ago and was told it was arthritis.  It has been getting worse and she would like to see podiatry.  She only needs a name =--- she does not need a referral.    No other complaints.   Review of Systems As above    Objective:   Physical Exam  Constitutional: She is oriented to person, place, and time. She appears well-developed and well-nourished.  Musculoskeletal: She exhibits no edema and no tenderness.       No pain with palp only with weight bearing  Neurological: She is alert and oriented to person, place, and time.  Psychiatric: She has a normal mood and affect. Her behavior is normal. Judgment and thought content normal.          Assessment & Plan:

## 2012-05-23 NOTE — Assessment & Plan Note (Signed)
celebrex 200 mg 1 po qd

## 2012-05-23 NOTE — Patient Instructions (Signed)

## 2012-06-09 ENCOUNTER — Ambulatory Visit
Admission: RE | Admit: 2012-06-09 | Discharge: 2012-06-09 | Disposition: A | Discharge status: Home / Self Care | Payer: No Typology Code available for payment source | Source: Ambulatory Visit | Attending: Obstetrics and Gynecology | Admitting: Obstetrics and Gynecology

## 2012-06-09 DIAGNOSIS — Z1231 Encounter for screening mammogram for malignant neoplasm of breast: Secondary | ICD-10-CM

## 2012-06-13 ENCOUNTER — Other Ambulatory Visit: Payer: Self-pay | Admitting: Obstetrics and Gynecology

## 2012-06-13 DIAGNOSIS — R928 Other abnormal and inconclusive findings on diagnostic imaging of breast: Secondary | ICD-10-CM

## 2012-06-16 ENCOUNTER — Telehealth: Payer: Self-pay | Admitting: Family Medicine

## 2012-06-16 DIAGNOSIS — F329 Major depressive disorder, single episode, unspecified: Secondary | ICD-10-CM

## 2012-06-16 MED ORDER — SERTRALINE HCL 50 MG PO TABS: 50.0000 mg | ORAL_TABLET | Freq: Every day | ORAL | Status: DC

## 2012-06-16 NOTE — Telephone Encounter (Signed)
refill for Sertraline HCl (Tab) sent on 10.25.13, was never received can you re[facility]

## 2012-06-20 ENCOUNTER — Other Ambulatory Visit: Payer: No Typology Code available for payment source

## 2012-06-25 ENCOUNTER — Ambulatory Visit
Admission: RE | Admit: 2012-06-25 | Discharge: 2012-06-25 | Disposition: A | Discharge status: Home / Self Care | Payer: No Typology Code available for payment source | Source: Ambulatory Visit | Attending: Obstetrics and Gynecology | Admitting: Obstetrics and Gynecology

## 2012-06-25 DIAGNOSIS — R928 Other abnormal and inconclusive findings on diagnostic imaging of breast: Secondary | ICD-10-CM

## 2012-08-05 ENCOUNTER — Telehealth: Payer: Self-pay

## 2012-08-06 NOTE — Telephone Encounter (Signed)
Error.     KP 

## 2012-09-16 ENCOUNTER — Other Ambulatory Visit: Payer: Self-pay | Admitting: Family Medicine

## 2013-03-15 ENCOUNTER — Other Ambulatory Visit: Payer: Self-pay | Admitting: Family Medicine

## 2013-04-16 ENCOUNTER — Other Ambulatory Visit: Payer: Self-pay | Admitting: Family Medicine

## 2013-05-22 ENCOUNTER — Ambulatory Visit: Payer: No Typology Code available for payment source | Admitting: Family Medicine

## 2013-08-18 ENCOUNTER — Other Ambulatory Visit: Payer: Self-pay

## 2013-08-18 DIAGNOSIS — Z1231 Encounter for screening mammogram for malignant neoplasm of breast: Secondary | ICD-10-CM

## 2013-08-24 ENCOUNTER — Encounter: Payer: Self-pay | Admitting: Family Medicine

## 2013-08-24 ENCOUNTER — Ambulatory Visit (INDEPENDENT_AMBULATORY_CARE_PROVIDER_SITE_OTHER): Payer: Managed Care, Other (non HMO) | Admitting: Family Medicine

## 2013-08-24 VITALS — BP 118/70 | HR 80 | Temp 98.4°F | Ht 70.25 in | Wt 157.0 lb

## 2013-08-24 DIAGNOSIS — F3289 Other specified depressive episodes: Secondary | ICD-10-CM

## 2013-08-24 DIAGNOSIS — F329 Major depressive disorder, single episode, unspecified: Secondary | ICD-10-CM

## 2013-08-24 DIAGNOSIS — F32A Depression, unspecified: Secondary | ICD-10-CM

## 2013-08-24 MED ORDER — SERTRALINE HCL 50 MG PO TABS: ORAL_TABLET | ORAL | Status: DC

## 2013-08-24 NOTE — Progress Notes (Signed)
   Subjective:    Patient ID: Jaime MilletJudy Brasil, female    DOB: 1956/07/26, 58 y.o.   MRN: [id number]  HPI Pt here for f/u depression.  She is doing well.  No complaints.      Review of Systems    as above Objective:   Physical Exam  BP 118/70  Pulse 80  Temp(Src) 98.4 F (36.9 C) (Oral)  Ht 5' 10.25" (1.784 m)  Wt 157 lb (71.215 kg)  BMI 22.38 kg/m2  SpO2 98% General appearance: alert, cooperative, appears stated age and no distress Neurologic: Alert and oriented X 3, normal strength and tone. Normal symmetric reflexes. Normal coordination and gait      Assessment & Plan:

## 2013-08-24 NOTE — Patient Instructions (Signed)

## 2013-08-24 NOTE — Progress Notes (Signed)
Pre visit review using our clinic review tool, if applicable. No additional management support is needed unless otherwise documented below in the visit note. 

## 2013-08-24 NOTE — Assessment & Plan Note (Signed)
Refill meds rto cpe

## 2013-09-03 ENCOUNTER — Ambulatory Visit
Admission: RE | Admit: 2013-09-03 | Discharge: 2013-09-03 | Disposition: A | Discharge status: Home / Self Care | Payer: Managed Care, Other (non HMO) | Source: Ambulatory Visit

## 2013-09-03 DIAGNOSIS — Z1231 Encounter for screening mammogram for malignant neoplasm of breast: Secondary | ICD-10-CM

## 2014-08-27 ENCOUNTER — Other Ambulatory Visit: Payer: Self-pay

## 2014-08-27 DIAGNOSIS — Z1231 Encounter for screening mammogram for malignant neoplasm of breast: Secondary | ICD-10-CM

## 2014-09-17 ENCOUNTER — Ambulatory Visit
Admission: RE | Admit: 2014-09-17 | Discharge: 2014-09-17 | Disposition: A | Discharge status: Home / Self Care | Payer: Managed Care, Other (non HMO) | Source: Ambulatory Visit

## 2014-09-17 DIAGNOSIS — Z1231 Encounter for screening mammogram for malignant neoplasm of breast: Secondary | ICD-10-CM

## 2014-09-21 ENCOUNTER — Other Ambulatory Visit: Payer: Self-pay | Admitting: Obstetrics and Gynecology

## 2014-09-21 DIAGNOSIS — R928 Other abnormal and inconclusive findings on diagnostic imaging of breast: Secondary | ICD-10-CM

## 2014-09-28 ENCOUNTER — Ambulatory Visit
Admission: RE | Admit: 2014-09-28 | Discharge: 2014-09-28 | Disposition: A | Discharge status: Home / Self Care | Payer: Managed Care, Other (non HMO) | Source: Ambulatory Visit | Attending: Obstetrics and Gynecology | Admitting: Obstetrics and Gynecology

## 2014-09-28 ENCOUNTER — Other Ambulatory Visit: Payer: Self-pay | Admitting: Obstetrics and Gynecology

## 2014-09-28 DIAGNOSIS — R928 Other abnormal and inconclusive findings on diagnostic imaging of breast: Secondary | ICD-10-CM

## 2014-12-06 ENCOUNTER — Telehealth: Payer: Self-pay | Admitting: Family Medicine

## 2014-12-06 DIAGNOSIS — F329 Major depressive disorder, single episode, unspecified: Secondary | ICD-10-CM

## 2014-12-06 DIAGNOSIS — F32A Depression, unspecified: Secondary | ICD-10-CM

## 2014-12-06 MED ORDER — SERTRALINE HCL 50 MG PO TABS: ORAL_TABLET | ORAL | Status: DC

## 2014-12-06 NOTE — Telephone Encounter (Signed)
Caller: Jaime Henderson Rel to pt: self Ph#: [phone] [facility] [address]   Reason: needs refill sent on sertraline (ZOLOFT) 50 MG tablet [[id number]][[id number]] using 1/day. States that there are no refills left but states the medication was [facility]. Pt states she was seen in Oct 2015 but I see no record of this visit.

## 2014-12-06 NOTE — Telephone Encounter (Signed)
She needs an OV she was last seen in Jan 2015.      KP

## 2014-12-06 NOTE — Telephone Encounter (Signed)
Called pt and scheduled appt. Notified her 15 doses were called into her pharmacy. Pt states she may not be able to get off work to come in. Scheduled appt for Friday 5/20 1:15pm.

## 2014-12-07 ENCOUNTER — Other Ambulatory Visit: Payer: Self-pay

## 2014-12-07 NOTE — Telephone Encounter (Signed)
Duplicate request.    KP 

## 2014-12-17 ENCOUNTER — Ambulatory Visit (INDEPENDENT_AMBULATORY_CARE_PROVIDER_SITE_OTHER): Payer: Managed Care, Other (non HMO) | Admitting: Family Medicine

## 2014-12-17 ENCOUNTER — Encounter: Payer: Self-pay | Admitting: Family Medicine

## 2014-12-17 VITALS — BP 116/76 | HR 74 | Temp 99.0°F | Wt 156.2 lb

## 2014-12-17 DIAGNOSIS — F329 Major depressive disorder, single episode, unspecified: Secondary | ICD-10-CM | POA: Diagnosis not present

## 2014-12-17 DIAGNOSIS — F32A Depression, unspecified: Secondary | ICD-10-CM

## 2014-12-17 NOTE — Progress Notes (Signed)
Pre visit review using our clinic review tool, if applicable. No additional management support is needed unless otherwise documented below in the visit note. 

## 2014-12-17 NOTE — Patient Instructions (Signed)
Wean zoloft --- 25 mg daily for 1 week and stop Return for physical

## 2014-12-19 NOTE — Progress Notes (Signed)
Patient ID: Jaime MilletJudy Hillis, female    DOB: [birth year]  Age: 59 y.o. MRN: [id number]    Subjective:  Subjective HPI Jaime Henderson presents for f/u depression.  No complaints. She is doing well and thinks she may be able to come off zoloft.    Review of Systems  Constitutional: Negative for activity change, appetite change and unexpected weight change.  Respiratory: Negative for cough and shortness of breath.   Cardiovascular: Negative for chest pain and palpitations.  Psychiatric/Behavioral: Negative for behavioral problems and dysphoric mood. The patient is not nervous/anxious.     History Past Medical History  Diagnosis Date  . Depression     She has no past surgical history on file.   Her family history includes Anemia in her brother; Cancer in her other.She reports that she has never smoked. She has never used smokeless tobacco. She reports that she does not drink alcohol or use illicit drugs.  Current Outpatient Prescriptions on File Prior to Visit  Medication Sig Dispense Refill  . calcium carbonate (OS-CAL) 600 MG TABS Take 600 mg by mouth 2 (two) times daily with a meal.      . fish oil-omega-3 fatty acids 1000 MG capsule Take 1 g by mouth daily.      . Glucosamine-Chondroit-Vit C-Mn (GLUCOSAMINE 1500 COMPLEX PO) Take by mouth.     No current facility-administered medications on file prior to visit.     Objective:  Objective Physical Exam  Constitutional: She is oriented to person, place, and time. She appears well-developed and well-nourished.  HENT:  Head: Normocephalic and atraumatic.  Eyes: Conjunctivae and EOM are normal.  Neck: Normal range of motion. Neck supple. No JVD present. Carotid bruit is not present. No thyromegaly present.  Cardiovascular: Normal rate, regular rhythm and normal heart sounds.   No murmur heard. Pulmonary/Chest: Effort normal and breath sounds normal. No respiratory distress. She has no wheezes. She has no rales. She exhibits no tenderness.    Musculoskeletal: She exhibits no edema.  Neurological: She is alert and oriented to person, place, and time.  Psychiatric: She has a normal mood and affect. Her behavior is normal. Judgment and thought content normal.   BP 116/76 mmHg  Pulse 74  Temp(Src) 99 F (37.2 C) (Oral)  Wt 156 lb 3.2 oz (70.852 kg)  SpO2 98% Wt Readings from Last 3 Encounters:  12/17/14 156 lb 3.2 oz (70.852 kg)  08/24/13 157 lb (71.215 kg)  05/23/12 180 lb 12.8 oz (82.01 kg)     Lab Results  Component Value Date   WBC 6.5 05/14/2011   HGB 13.7 05/14/2011   HCT 41.1 05/14/2011   PLT 269 05/14/2011   GLUCOSE 93 05/14/2011   ALT 11 05/14/2011   AST 16 05/14/2011   NA 138 05/14/2011   K 4.0 05/14/2011   CL 102 05/14/2011   CREATININE 0.78 05/14/2011   BUN 17 05/14/2011   CO2 26 05/14/2011    Mm Diag Breast Tomo Uni Left  09/28/2014   CLINICAL DATA:  Patient recalled from screening for left breast asymmetry demonstrated medially on the CC view only.  EXAM: DIGITAL DIAGNOSTIC UNILATERAL LEFT MAMMOGRAM WITH 3D TOMOSYNTHESIS AND CAD  COMPARISON:  [facility]  ACR Breast Density Category c: The breast tissue is heterogeneously dense, which may obscure small masses.  FINDINGS: Questioned asymmetry within the medial aspect of the left breast on the CC view only appeared to resolve with additional imaging including spot compression and tomosynthesis images. No suspicious masses, architectural distortion  or calcifications identified within the left breast.  Mammographic images were processed with CAD.  IMPRESSION: No mammographic evidence for malignancy.  Left breast asymmetry resolved, compatible with overlapping fibroglandular breast tissue.  RECOMMENDATION: Screening mammogram in one year.(Code:SM-B-01Y)  I have discussed the findings and recommendations with the patient. Results were also provided in writing at the conclusion of the visit. If applicable, a reminder letter will be sent to the patient regarding the next  appointment.  BI-RADS CATEGORY  1: Negative.   Electronically Signed   By: Annia Belt M.D.   On: 09/28/2014 16:07     Assessment & Plan:  Plan I have discontinued Ms. Strey's sertraline. I am also having her maintain her fish oil-omega-3 fatty acids, calcium carbonate, and Glucosamine-Chondroit-Vit C-Mn (GLUCOSAMINE 1500 COMPLEX PO).  No orders of the defined types were placed in this encounter.    Problem List Items Addressed This Visit    None    Visit Diagnoses    Depression    -  Primary       Follow-up: Return if symptoms worsen or fail to improve, for cpe.  Loreen Freud, DO

## 2014-12-19 NOTE — Assessment & Plan Note (Signed)
Wean off of zoloft rto cpe

## 2015-06-17 ENCOUNTER — Encounter: Payer: Self-pay | Admitting: Internal Medicine

## 2015-08-18 ENCOUNTER — Encounter: Payer: Self-pay | Admitting: Gastroenterology

## 2015-10-28 ENCOUNTER — Other Ambulatory Visit: Payer: Self-pay

## 2015-10-28 DIAGNOSIS — Z1231 Encounter for screening mammogram for malignant neoplasm of breast: Secondary | ICD-10-CM

## 2015-11-17 ENCOUNTER — Ambulatory Visit
Admission: RE | Admit: 2015-11-17 | Discharge: 2015-11-17 | Disposition: A | Discharge status: Home / Self Care | Payer: Managed Care, Other (non HMO) | Source: Ambulatory Visit

## 2015-11-17 DIAGNOSIS — Z1231 Encounter for screening mammogram for malignant neoplasm of breast: Secondary | ICD-10-CM

## 2016-09-24 ENCOUNTER — Telehealth: Payer: Self-pay

## 2016-09-24 DIAGNOSIS — Z23 Encounter for immunization: Secondary | ICD-10-CM

## 2016-09-26 NOTE — Telephone Encounter (Signed)
Enter error. LB 

## 2016-10-25 ENCOUNTER — Other Ambulatory Visit: Payer: Self-pay | Admitting: Family Medicine

## 2016-10-25 DIAGNOSIS — Z1231 Encounter for screening mammogram for malignant neoplasm of breast: Secondary | ICD-10-CM

## 2016-11-16 ENCOUNTER — Encounter: Payer: Self-pay | Admitting: *Deleted

## 2016-11-23 ENCOUNTER — Ambulatory Visit
Admission: RE | Admit: 2016-11-23 | Discharge: 2016-11-23 | Disposition: A | Discharge status: Home / Self Care | Payer: Managed Care, Other (non HMO) | Source: Ambulatory Visit | Attending: Family Medicine | Admitting: Family Medicine

## 2016-11-23 DIAGNOSIS — Z1231 Encounter for screening mammogram for malignant neoplasm of breast: Secondary | ICD-10-CM

## 2018-01-24 ENCOUNTER — Other Ambulatory Visit: Payer: Self-pay | Admitting: Family Medicine

## 2018-01-24 DIAGNOSIS — Z1231 Encounter for screening mammogram for malignant neoplasm of breast: Secondary | ICD-10-CM

## 2018-03-07 ENCOUNTER — Ambulatory Visit
Admission: RE | Admit: 2018-03-07 | Discharge: 2018-03-07 | Disposition: A | Discharge status: Home / Self Care | Payer: Managed Care, Other (non HMO) | Source: Ambulatory Visit | Attending: Family Medicine | Admitting: Family Medicine

## 2018-03-07 DIAGNOSIS — Z1231 Encounter for screening mammogram for malignant neoplasm of breast: Secondary | ICD-10-CM

## 2019-02-02 ENCOUNTER — Other Ambulatory Visit: Payer: Self-pay | Admitting: Family Medicine

## 2019-02-02 DIAGNOSIS — Z1231 Encounter for screening mammogram for malignant neoplasm of breast: Secondary | ICD-10-CM

## 2019-03-13 ENCOUNTER — Ambulatory Visit: Payer: Managed Care, Other (non HMO)

## 2019-04-10 ENCOUNTER — Ambulatory Visit
Admission: RE | Admit: 2019-04-10 | Discharge: 2019-04-10 | Disposition: A | Discharge status: Home / Self Care | Payer: Managed Care, Other (non HMO) | Source: Ambulatory Visit | Attending: Family Medicine | Admitting: Family Medicine

## 2019-04-10 ENCOUNTER — Other Ambulatory Visit: Payer: Self-pay

## 2019-04-10 DIAGNOSIS — Z1231 Encounter for screening mammogram for malignant neoplasm of breast: Secondary | ICD-10-CM

## 2020-04-08 ENCOUNTER — Other Ambulatory Visit: Payer: Self-pay | Admitting: Family Medicine

## 2020-04-08 DIAGNOSIS — Z1231 Encounter for screening mammogram for malignant neoplasm of breast: Secondary | ICD-10-CM

## 2020-05-20 ENCOUNTER — Ambulatory Visit: Payer: Managed Care, Other (non HMO)

## 2020-06-03 ENCOUNTER — Ambulatory Visit
Admission: RE | Admit: 2020-06-03 | Discharge: 2020-06-03 | Disposition: A | Discharge status: Home / Self Care | Payer: Medicare HMO | Source: Ambulatory Visit | Attending: Family Medicine | Admitting: Family Medicine

## 2020-06-03 ENCOUNTER — Other Ambulatory Visit: Payer: Self-pay

## 2020-06-03 DIAGNOSIS — Z1231 Encounter for screening mammogram for malignant neoplasm of breast: Secondary | ICD-10-CM

## 2021-05-01 ENCOUNTER — Other Ambulatory Visit: Payer: Self-pay | Admitting: Family Medicine

## 2021-05-01 DIAGNOSIS — Z1231 Encounter for screening mammogram for malignant neoplasm of breast: Secondary | ICD-10-CM

## 2021-06-16 ENCOUNTER — Ambulatory Visit
Admission: RE | Admit: 2021-06-16 | Discharge: 2021-06-16 | Disposition: A | Discharge status: Home / Self Care | Payer: Medicare HMO | Source: Ambulatory Visit | Attending: Family Medicine | Admitting: Family Medicine

## 2021-06-16 ENCOUNTER — Other Ambulatory Visit: Payer: Self-pay

## 2021-06-16 DIAGNOSIS — Z1231 Encounter for screening mammogram for malignant neoplasm of breast: Secondary | ICD-10-CM

## 2022-08-14 ENCOUNTER — Other Ambulatory Visit: Payer: Self-pay | Admitting: Family Medicine

## 2022-08-14 DIAGNOSIS — Z1231 Encounter for screening mammogram for malignant neoplasm of breast: Secondary | ICD-10-CM

## 2022-10-19 ENCOUNTER — Ambulatory Visit
Admission: RE | Admit: 2022-10-19 | Discharge: 2022-10-19 | Disposition: A | Discharge status: Home / Self Care | Payer: Medicare HMO | Source: Ambulatory Visit | Attending: Family Medicine | Admitting: Family Medicine

## 2022-10-19 DIAGNOSIS — Z1231 Encounter for screening mammogram for malignant neoplasm of breast: Secondary | ICD-10-CM

## 2023-09-20 ENCOUNTER — Other Ambulatory Visit: Payer: Self-pay | Admitting: Family Medicine

## 2023-09-20 DIAGNOSIS — Z1231 Encounter for screening mammogram for malignant neoplasm of breast: Secondary | ICD-10-CM

## 2023-10-25 ENCOUNTER — Ambulatory Visit
Admission: RE | Admit: 2023-10-25 | Discharge: 2023-10-25 | Disposition: A | Discharge status: Home / Self Care | Payer: Medicare HMO | Source: Ambulatory Visit | Attending: Family Medicine | Admitting: Family Medicine

## 2023-10-25 DIAGNOSIS — Z1231 Encounter for screening mammogram for malignant neoplasm of breast: Secondary | ICD-10-CM
